# Patient Record
Sex: Male | Born: 1995 | Race: Black or African American | Hispanic: No | Marital: Single
Health system: Southern US, Community
[De-identification: ages and names within clinical notes are randomized; demographics above are authoritative.]

---
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
## 2017-01-14 IMAGING — CT CT HEAD W/O CM
4 of 5 series · 16 of 47 positions shown, 17 images · non-contrast
Comparison: None.

CLINICAL DATA: Found down.  Intoxication.

EXAM:
CT HEAD WITHOUT CONTRAST
CT CERVICAL SPINE WITHOUT CONTRAST
TECHNIQUE: Multidetector CT imaging of the head and cervical spine was
performed following the standard protocol without intravenous
contrast. Multiplanar CT image reconstructions of the cervical spine
were also generated.

[Series 3: head w/o · axial · non-contrast · 0.43mm/px · z∈[-174,-84]mm · 4 of 30 slices shown, 5 images]
[im 6/30  brain]
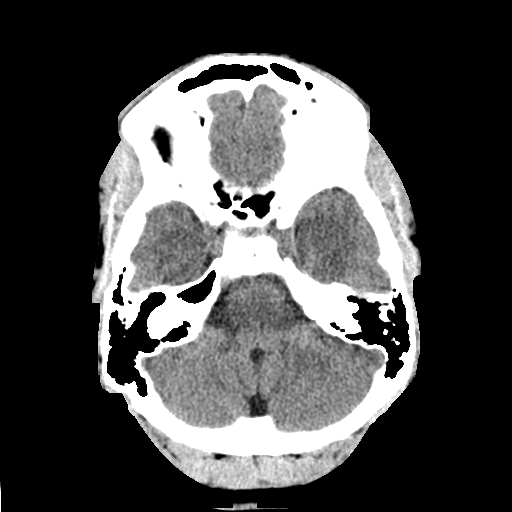
[im 6/30  bone]
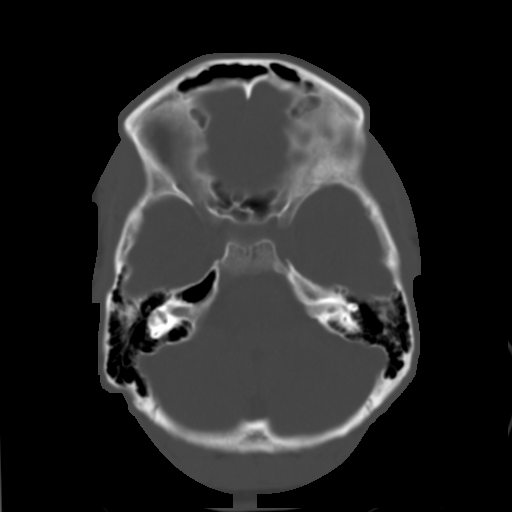
[im 12/30  brain]
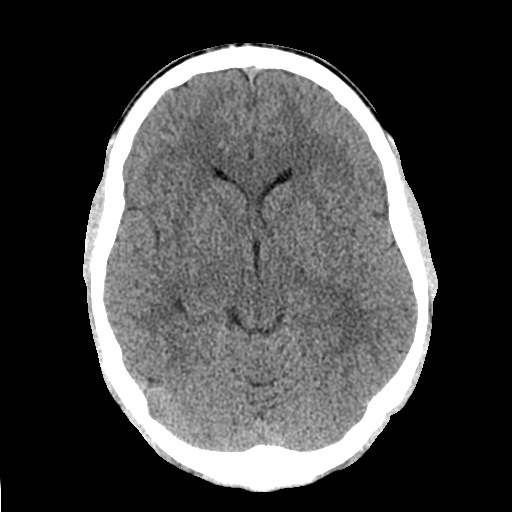
[im 18/30  brain]
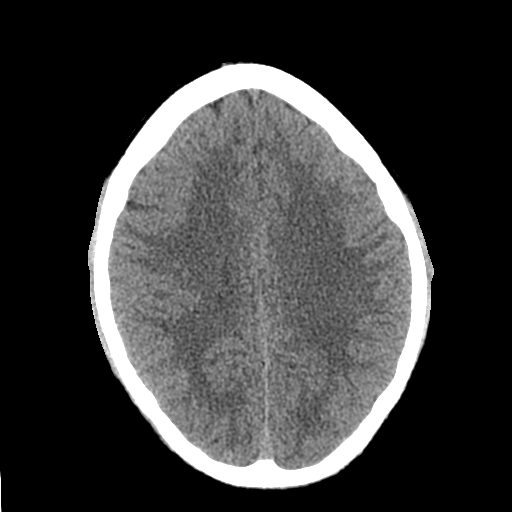
[im 24/30  brain]
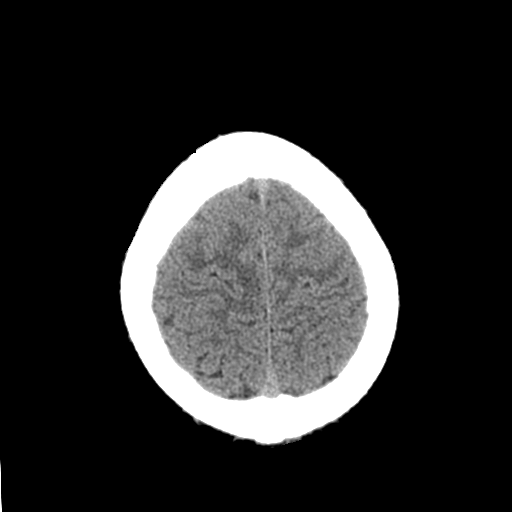

[Series 4: bone windows · axial · 0.43mm/px · z∈[-180,-90]mm · 6 of 50 slices shown]
[im 7/50  bone]
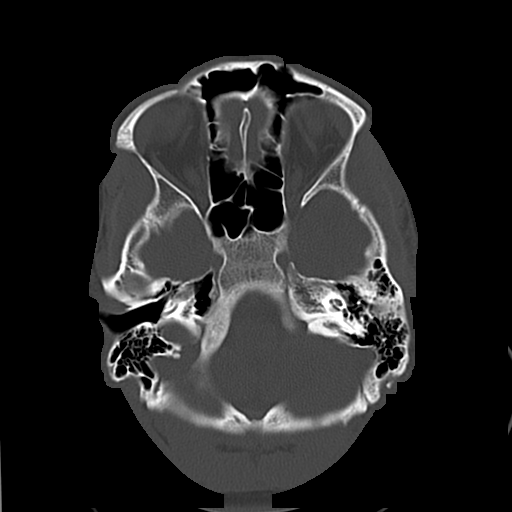
[im 13/50  bone]
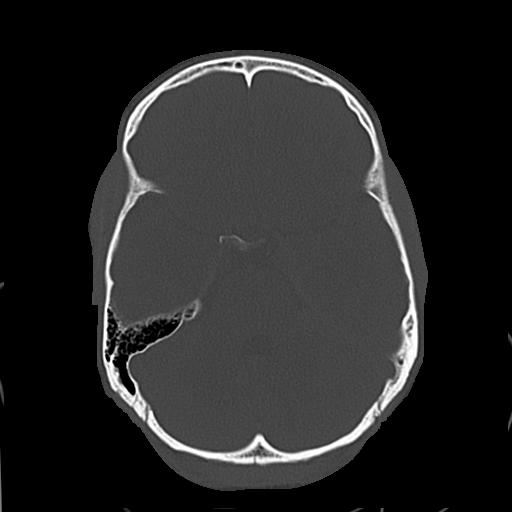
[im 19/50  bone]
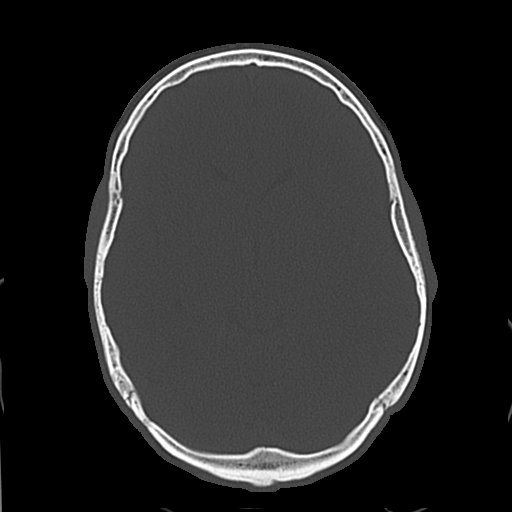
[im 25/50  bone]
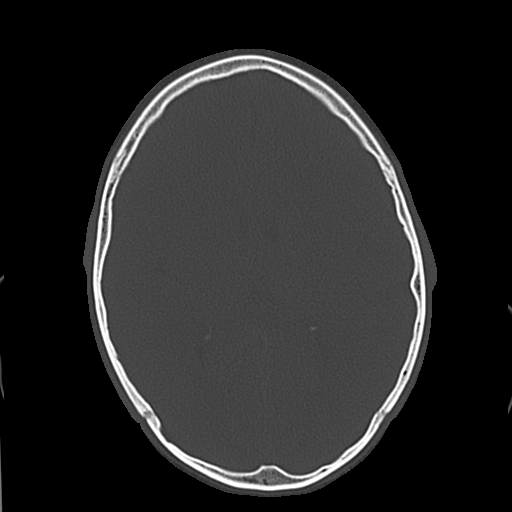
[im 31/50  bone]
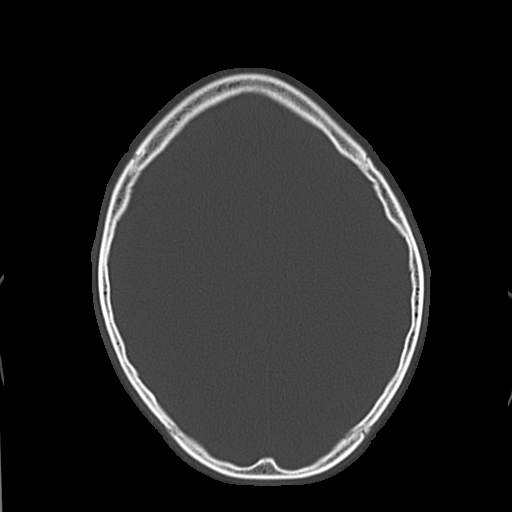
[im 37/50  bone]
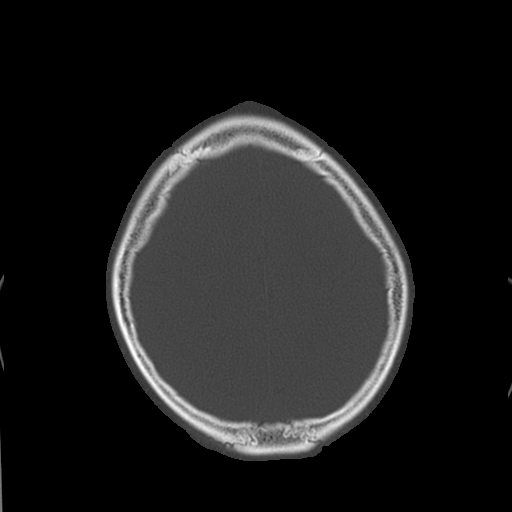

[Series 9: coronal · coronal · 0.23mm/px · 3 of 40 slices shown]
[im 14/40  brain]
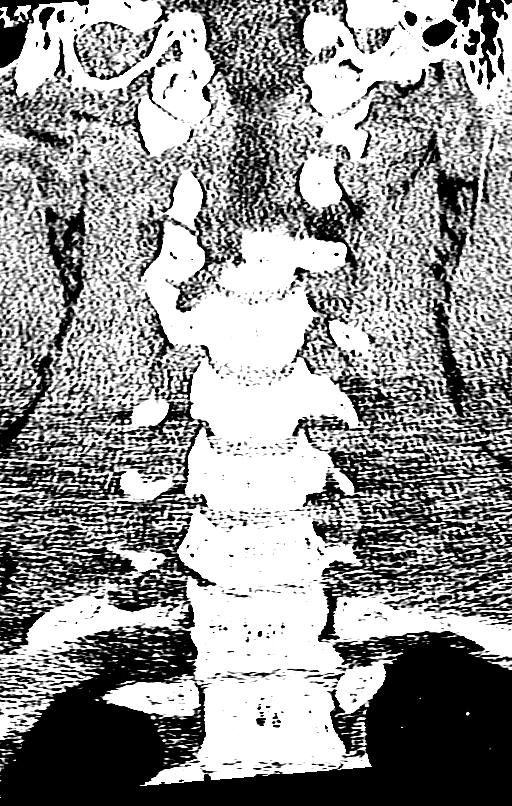
[im 18/40  brain]
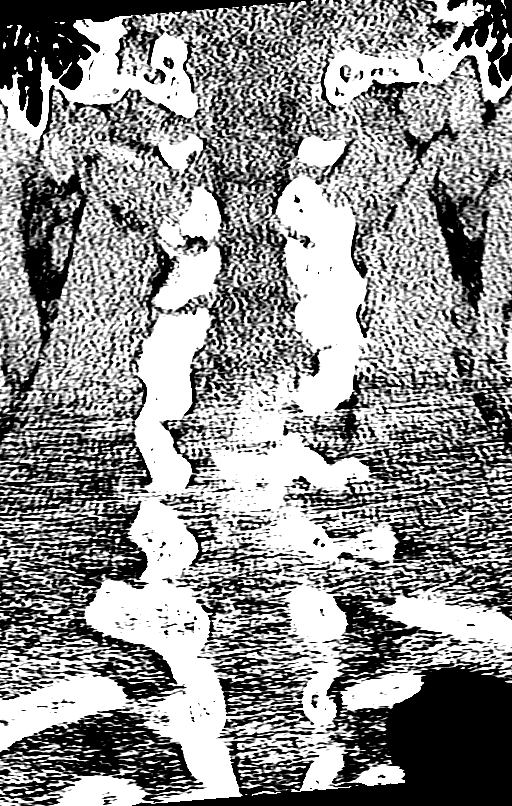
[im 22/40  brain]
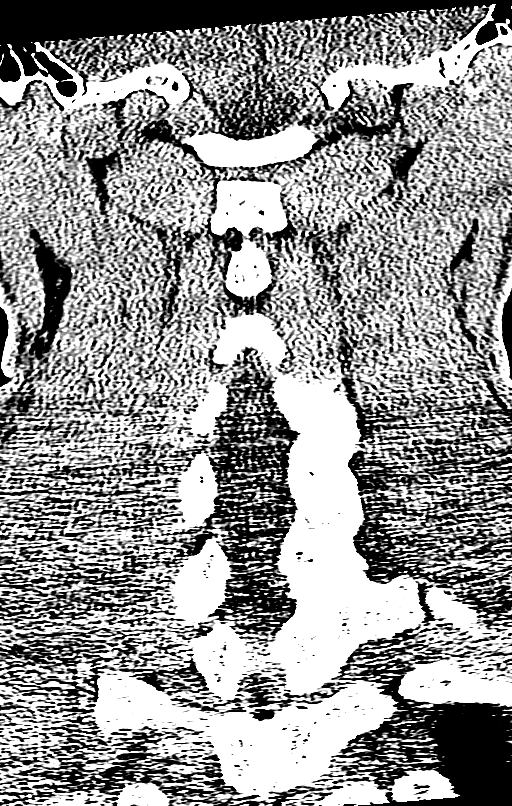

[Series 10: sagittal · sagittal · 0.29mm/px · 3 of 37 slices shown]
[im 13/37  brain]
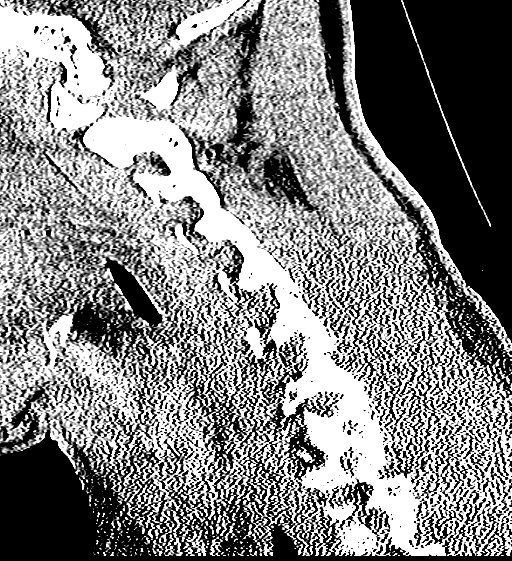
[im 19/37  brain]
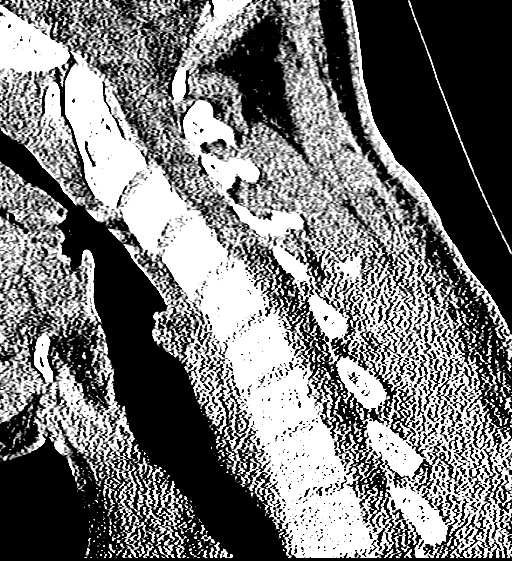
[im 25/37  brain]
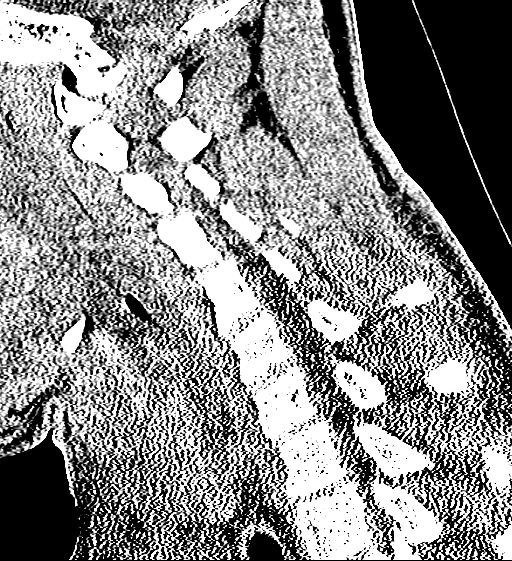

[16 of 47 positions shown; findings below may reference images not displayed]

FINDINGS: CT HEAD FINDINGS

Skull and Sinuses:Negative for fracture or destructive process. The
mastoids, middle ears, and imaged paranasal sinuses are clear.

Orbits: No acute abnormality.

Brain: No evidence of acute infarction, hemorrhage, hydrocephalus,
or mass lesion/mass effect.

CT CERVICAL SPINE FINDINGS

Cervical spine alignment which is likely positional. No subluxation.
Negative for acute fracture. No prevertebral edema. No gross
cervical canal hematoma. No significant osseous canal or foraminal
stenosis.
IMPRESSION: Negative head and cervical spine CT.
